# Patient Record
Sex: Male | Born: 1975 | Race: White | Hispanic: Yes | Marital: Married | Smoking: Never smoker
Health system: Southern US, Community
[De-identification: ages and names within clinical notes are randomized; demographics above are authoritative.]

---

## 2006-07-18 ENCOUNTER — Emergency Department (HOSPITAL_COMMUNITY): Admission: EM | Admit: 2006-07-18 | Discharge: 2006-07-18 | Payer: Self-pay | Admitting: *Deleted

## 2006-07-26 ENCOUNTER — Emergency Department (HOSPITAL_COMMUNITY): Admission: EM | Admit: 2006-07-26 | Discharge: 2006-07-26 | Payer: Self-pay | Admitting: Emergency Medicine

## 2014-01-10 ENCOUNTER — Emergency Department (HOSPITAL_COMMUNITY)
Admission: EM | Admit: 2014-01-10 | Discharge: 2014-01-10 | Disposition: A | Discharge status: Home / Self Care | Payer: Self-pay | Attending: Emergency Medicine | Admitting: Emergency Medicine

## 2014-01-10 ENCOUNTER — Encounter (HOSPITAL_COMMUNITY): Payer: Self-pay | Admitting: Emergency Medicine

## 2014-01-10 DIAGNOSIS — H5789 Other specified disorders of eye and adnexa: Secondary | ICD-10-CM | POA: Insufficient documentation

## 2014-01-10 DIAGNOSIS — H547 Unspecified visual loss: Secondary | ICD-10-CM

## 2014-01-10 DIAGNOSIS — H5460 Unqualified visual loss, one eye, unspecified: Secondary | ICD-10-CM | POA: Insufficient documentation

## 2014-01-10 NOTE — ED Notes (Signed)
Per family pts L eye dropped to outside corner and eyelid closed, pt now able to open eyelid but c/o eyeball "twitching" Pt had episode 2 weeks ago of temporary blindness in same eye. Pt was not seen for this

## 2014-01-10 NOTE — ED Provider Notes (Signed)
CSN: [id number]     Arrival date & time 01/10/14  2144 History   First MD Initiated Contact with Patient 01/10/14 2201     Chief Complaint  Patient presents with  . Eye Problem     (Consider location/radiation/quality/duration/timing/severity/associated sxs/prior Treatment) HPI Comments: 38 year old male, presents with a complaint of left eye abnormalities. He describes that 3 or 4 weeks ago he developed acute onset of visual loss in the left eye described as his vision going black, this was painless, unilateral and was associated with his left eyelid closing. He states that he could not open the eye by himself without the use of his hands, this lasted 5 or 6 minutes and then completely resolved. He denies any intermittent symptoms between then and today including no difficulty walking, talking or with balance. His vision has been clear but this evening he develop recurrent symptoms including family member stating that his left eye appeared to be facing downward into the left whereas his right eye was looking straight ahead. He reports that during this episode he again had painless visual loss. Family members report that his eye lid seemed to close, he was unable to open the eye by himself and after 5 or 6 minutes his symptoms resolved. On arrival the patient states that his vision is normal. He denies any fevers chills nausea vomiting coughing shortness of breath or any other complaints. He denies any ocular injury, no foreign body sensation, no tearing redness discharge or crusting.  Patient is a [age] y.o. male presenting with eye problem. The history is provided by the patient and a relative.  Eye Problem   History reviewed. No pertinent past medical history. History reviewed. No pertinent past surgical history. No family history on file. History  Substance Use Topics  . Smoking status: Never Smoker   . Smokeless tobacco: Not on file  . Alcohol Use: No    Review of Systems  All other  systems reviewed and are negative.     Allergies  Review of patient's allergies indicates no known allergies.  Home Medications   Prior to Admission medications   Not on File   BP 113/70  Pulse 82  Temp(Src) 97.4 F (36.3 C) (Oral)  Resp 18  Wt 177 lb (80.287 kg)  SpO2 98% Physical Exam  Nursing note and vitals reviewed. Constitutional: He appears well-developed and well-nourished. No distress.  HENT:  Head: Normocephalic and atraumatic.  Mouth/Throat: Oropharynx is clear and moist. No oropharyngeal exudate.  Eyes: Conjunctivae and EOM are normal. Pupils are equal, round, and reactive to light. Right eye exhibits no discharge. Left eye exhibits no discharge. No scleral icterus.  Normal extraocular movements, normal pupillary exam, normal eyelid exam, conjunctiva are clear, there is no drainage redness crusting or pooling of secretions. There is no swelling of the periorbital tissues. There is normal consensual response to light. Optic discs are seen bilaterally and are clear, no papilledema is seen.  Visual acuity  Bilateral 20/20 Right 20/20 Left 20/30  Neck: Normal range of motion. Neck supple. No JVD present. No thyromegaly present.  Cardiovascular: Normal rate, regular rhythm, normal heart sounds and intact distal pulses.  Exam reveals no gallop and no friction rub.   No murmur heard. Pulmonary/Chest: Effort normal and breath sounds normal. No respiratory distress. He has no wheezes. He has no rales.  Abdominal: Soft. Bowel sounds are normal. He exhibits no distension and no mass. There is no tenderness.  Musculoskeletal: Normal range of motion. He exhibits no  edema and no tenderness.  Lymphadenopathy:    He has no cervical adenopathy.  Neurological: He is alert. Coordination normal.  Skin: Skin is warm and dry. No rash noted. No erythema.  Psychiatric: He has a normal mood and affect. His behavior is normal.    ED Course  Procedures (including critical care  time) Labs Review Labs Reviewed - No data to display  Imaging Review No results found.    MDM   Final diagnoses:  Visual loss    The patient's exam shows no signs of ptosis or lid lag, no extraocular abnormalities and no other cranial nerve abnormalities. His visual acuity showed 20/30 in the left eye but otherwise normal, it is a rather unremarkable exam and in he has no positive findings here. This would apparently involve the second cranial nerve, third cranial nerve, possibly the fourth cranial nerve as well as the seventh cranial nerve. The patient has no other neurologic symptoms. Will refer to ophthalmology after discussion with neurology.  Discussed with Dr.Camilo who agrees that the patient would benefit from MRI scan with and without contrast in the morning however he does not think the patient needs to be admitted. The patient will be informed of the plan  10:50 PM - pt in agreement with plan - still has no sx at this time.  11:10 PM, discussed with patient the indication for MRI in the morning, he refuses stating that he cannot miss work. He states that he will follow up with ophthalmology as outpatient or return if symptoms worsen. Return precautions given, patient again cautioned that he needs a testing but he states that he cannot come in for it. He is still not having any symptoms at this time.  Vida Roller, MD 01/10/14 [phone]

## 2014-01-10 NOTE — Discharge Instructions (Signed)
My medical recommendation to you is that you need an MRI of your brain - this is what I and the Neurologist have both recommended - please follow up with the Eye specialist - call for next available appointment in the morning but you should return to the hospital for immediate reevaluation if you develop recurrent visual loss / pain / facial droop or inability to open your eyelid.

## 2014-01-11 NOTE — Progress Notes (Signed)
  CARE MANAGEMENT ED NOTE 01/11/2014  Patient:  Antonio Tate,[name]   Account Number:  [id number]  Date Initiated:  01/11/2014  Documentation initiated by:  Radford PaxFERRERO,Ciela Mahajan  Subjective/Objective Assessment:   Patient presents to Ed with left eye abnormality     Subjective/Objective Assessment Detail:     Action/Plan:   Action/Plan Detail:   Anticipated DC Date:  01/11/2014     Status Recommendation to Physician:   Result of Recommendation:    Other ED Services  Consult Working Plan    DC Planning Services  Other  PCP issues    Choice offered to / List presented to:            Status of service:  Completed, signed off  ED Comments:   ED Comments Detail:  EDCM spoke to patient at bedside.  Patient confirms he does not have a pcp or insurance.  [facility] with [facility].  [facility] informed [facility] that walk [facility] are welcome from 9am-1030am Mon-Thurs. [facility] informed patient that he may establish care, speak to a financial counselor, receive assistance with medications, and [facility].  EDCM also provided patient with phone number to inquire about Affordable Care Act, and DSS for Medicaid for insurance.  EDCM also provided patient with list of discounted pharmacies and websites needymeds.org and Good https://figueroa.info/X.com for medication assistance, list of financial resources inthe community such as [employer] and local churches, urban ministries and dental assistance for uninsured patients.  Patient thankful for resources.  No further EDCM needs at this time.

## 2017-07-16 ENCOUNTER — Ambulatory Visit: Payer: Managed Care, Other (non HMO) | Admitting: Family Medicine

## 2017-07-16 ENCOUNTER — Encounter: Payer: Self-pay | Admitting: Family Medicine

## 2017-07-16 VITALS — BP 126/80 | HR 84 | Temp 98.4°F | Ht 68.0 in | Wt 188.6 lb

## 2017-07-16 DIAGNOSIS — M79661 Pain in right lower leg: Secondary | ICD-10-CM | POA: Diagnosis not present

## 2017-07-16 DIAGNOSIS — F524 Premature ejaculation: Secondary | ICD-10-CM | POA: Diagnosis not present

## 2017-07-16 DIAGNOSIS — M79662 Pain in left lower leg: Secondary | ICD-10-CM

## 2017-07-16 MED ORDER — CITALOPRAM HYDROBROMIDE 20 MG PO TABS
20.0000 mg | ORAL_TABLET | Freq: Every day | ORAL | 3 refills | Status: DC
Start: 2017-07-16 — End: 2017-10-04

## 2017-07-16 NOTE — Patient Instructions (Signed)
Please start the celexa.  We will get a test to make sure you are getting good flow into your legs. You will be called to have this scheduled.  Please come back soon for your annual physical exam.  Take care, Dr Jimmey RalphParker

## 2017-07-16 NOTE — Assessment & Plan Note (Signed)
Start Celexa 20 mg daily.  Follow-up in about 3 months.

## 2017-07-16 NOTE — Progress Notes (Signed)
Subjective:  Antonio Tate a 42 y.o. male who presents today with a chief complaint of leg pain and to establish care.   HPI:  Leg Pain, New Issue Patient with symptoms for at least 20 years. It has been stable over that time. Symptoms are worse with exertion, but can be present constantly. Pain located strictly to his calves bilaterally. Sometimes has to stop driving due to the pain. Has never seen a physician for this before. No treatments tried. No obvious precipitating events. No obvious alleviating or aggravating factors.   Premature ejaculation, new issue Several year history.  Stable over last several years.  Previous treatments tried.  Normal libido.  No erectile dysfunction.  No obvious precipitating events.  No obvious alleviating or aggravating factors.  Has never seen a physician for this.  ROS: Per HPI, otherwise a 10 point review of systems was performed and was negative  PMH:  The following were reviewed and entered/updated in epic: History reviewed. No pertinent past medical history. Patient Active Problem List   Diagnosis Date Noted  . Bilateral calf pain 07/16/2017  . Premature ejaculation 07/16/2017   History reviewed. No pertinent surgical history.  Family History  Problem Relation Age of Onset  . Heart attack Father   . CVA Brother     Medications- reviewed and updated Current Outpatient Medications  Medication Sig Dispense Refill  . brompheniramine-pseudoephedrine-DM 30-2-10 MG/5ML syrup Take 10 mLs by mouth 4 (four) times daily as needed.    . citalopram (CELEXA) 20 MG tablet Take 1 tablet (20 mg total) by mouth daily. 30 tablet 3   No current facility-administered medications for this visit.    Allergies-reviewed and updated No Known Allergies  Social History   Socioeconomic History  . Marital status: Married    Spouse name: None  . Number of children: 1  . Years of education: None  . Highest education level: None  Social Needs  .  Financial resource strain: None  . Food insecurity - worry: None  . Food insecurity - inability: None  . Transportation needs - medical: None  . Transportation needs - non-medical: None  Occupational History  . None  Tobacco Use  . Smoking status: Never Smoker  . Smokeless tobacco: Never Used  Substance and Sexual Activity  . Alcohol use: No  . Drug use: No  . Sexual activity: None  Other Topics Concern  . None  Social History Narrative  . None     Objective:  Physical Exam: BP 126/80 (BP Location: Left Arm, Patient Position: Sitting, Cuff Size: Normal)   Pulse 84   Temp 98.4 F (36.9 C) (Oral)   Ht 5\' 8"  (1.727 m)   Wt 188 lb 9.6 oz (85.5 kg)   SpO2 97%   BMI 28.68 kg/m   Gen: NAD, resting comfortably CV: RRR with no murmurs appreciated Pulm: NWOB, CTAB with no crackles, wheezes, or rhonchi GI: Normal bowel sounds present. Soft, Nontender, Nondistended. MSK: -Back: No deformities.  Full range of motion.  No spinous process tenderness. -Hips: No deformities.  Full range of motion.  Strength 5 out of 5 in all directions. -Knees: No deformities.  Full range of motion.  Strength 5 out of 5 in all directions.  No popliteal masses noted. -Calves: Hypertrophy of gastrocnemius noted bilaterally.  Mildly tender to palpation. -Feet: No deformities.  Strength 5 out of 5 with plantarflexion and dorsiflexion.  DP and PT pulses 2+ and symmetric bilaterally. Skin: Warm, dry Neuro: Grossly  normal, moves all extremities Psych: Normal affect and thought content  Assessment/Plan:  Bilateral calf pain Unclear etiology.  Concern for intermittent compression syndrome versus popliteal artery entrapment.  We will check ABIs.  Hope to have these done pre-and post exercise.  If negative, would consider angiogram to rule out popliteal artery entrapment or referral for compartment syndrome testing.  Premature ejaculation Start Celexa 20 mg daily.  Follow-up in about 3 months.  Preventative  healthcare Patient will return soon for CPE.  Katina Degree. Jimmey Ralph, MD 07/16/2017 5:11 PM

## 2017-07-16 NOTE — Assessment & Plan Note (Signed)
Unclear etiology.  Concern for intermittent compression syndrome versus popliteal artery entrapment.  We will check ABIs.  Hope to have these done pre-and post exercise.  If negative, would consider angiogram to rule out popliteal artery entrapment or referral for compartment syndrome testing.

## 2017-08-09 ENCOUNTER — Other Ambulatory Visit: Payer: Self-pay | Admitting: Family Medicine

## 2017-08-09 DIAGNOSIS — M79662 Pain in left lower leg: Principal | ICD-10-CM

## 2017-08-09 DIAGNOSIS — M79661 Pain in right lower leg: Secondary | ICD-10-CM

## 2017-08-09 NOTE — Progress Notes (Signed)
[id number]

## 2017-08-12 ENCOUNTER — Ambulatory Visit (HOSPITAL_COMMUNITY)
Admission: RE | Admit: 2017-08-12 | Discharge: 2017-08-12 | Disposition: A | Discharge status: Home / Self Care | Payer: Managed Care, Other (non HMO) | Source: Ambulatory Visit | Attending: Family Medicine | Admitting: Family Medicine

## 2017-08-12 DIAGNOSIS — M79662 Pain in left lower leg: Secondary | ICD-10-CM | POA: Diagnosis not present

## 2017-08-12 DIAGNOSIS — M79661 Pain in right lower leg: Secondary | ICD-10-CM | POA: Insufficient documentation

## 2017-08-17 ENCOUNTER — Other Ambulatory Visit: Payer: Self-pay

## 2017-08-17 DIAGNOSIS — M79669 Pain in unspecified lower leg: Secondary | ICD-10-CM

## 2017-08-23 ENCOUNTER — Ambulatory Visit (INDEPENDENT_AMBULATORY_CARE_PROVIDER_SITE_OTHER): Payer: Self-pay | Admitting: Orthopaedic Surgery

## 2017-08-31 ENCOUNTER — Ambulatory Visit (INDEPENDENT_AMBULATORY_CARE_PROVIDER_SITE_OTHER): Payer: Self-pay | Admitting: Orthopaedic Surgery

## 2017-10-04 ENCOUNTER — Other Ambulatory Visit: Payer: Self-pay

## 2017-10-04 MED ORDER — CITALOPRAM HYDROBROMIDE 20 MG PO TABS: 20.0000 mg | ORAL_TABLET | Freq: Every day | ORAL | 3 refills | Status: DC

## 2017-10-05 ENCOUNTER — Other Ambulatory Visit: Payer: Self-pay | Admitting: *Deleted

## 2017-10-05 MED ORDER — CITALOPRAM HYDROBROMIDE 20 MG PO TABS: 20.0000 mg | ORAL_TABLET | Freq: Every day | ORAL | 3 refills | Status: DC

## 2017-12-08 ENCOUNTER — Encounter: Payer: Self-pay | Admitting: Family Medicine

## 2017-12-09 ENCOUNTER — Encounter: Payer: Self-pay | Admitting: Family Medicine

## 2017-12-09 ENCOUNTER — Ambulatory Visit (INDEPENDENT_AMBULATORY_CARE_PROVIDER_SITE_OTHER): Payer: Managed Care, Other (non HMO) | Admitting: Family Medicine

## 2017-12-09 VITALS — BP 122/74 | HR 84 | Temp 98.0°F | Ht 68.0 in | Wt 188.2 lb

## 2017-12-09 DIAGNOSIS — Z23 Encounter for immunization: Secondary | ICD-10-CM

## 2017-12-09 DIAGNOSIS — L659 Nonscarring hair loss, unspecified: Secondary | ICD-10-CM

## 2017-12-09 DIAGNOSIS — F524 Premature ejaculation: Secondary | ICD-10-CM | POA: Diagnosis not present

## 2017-12-09 DIAGNOSIS — Z13220 Encounter for screening for lipoid disorders: Secondary | ICD-10-CM

## 2017-12-09 DIAGNOSIS — Z0001 Encounter for general adult medical examination with abnormal findings: Secondary | ICD-10-CM

## 2017-12-09 DIAGNOSIS — R200 Anesthesia of skin: Secondary | ICD-10-CM

## 2017-12-09 DIAGNOSIS — Z114 Encounter for screening for human immunodeficiency virus [HIV]: Secondary | ICD-10-CM

## 2017-12-09 MED ORDER — CITALOPRAM HYDROBROMIDE 20 MG PO TABS: 20.0000 mg | ORAL_TABLET | Freq: Every day | ORAL | 3 refills | Status: DC

## 2017-12-09 NOTE — Addendum Note (Signed)
Addended by: Koleen DistanceAGNER, AMBER M on: 12/09/2017 04:59 PM   Modules accepted: Orders

## 2017-12-09 NOTE — Progress Notes (Signed)
Subjective:  Antonio Tate is a 42 y.o. male who presents today for his annual comprehensive physical exam.    HPI:  He has 2 minor complaints today:  1. Hairless patch on left chin.  No signs of few weeks ago.  Stable over that time.  No clear precipitating events.  No redness.  No irritation.  No pain.  2.  Right fifth finger numbness.  Only happens when waking up from sleep.  Takes may be due to sleep position.  No weakness or numbness.  Lifestyle Diet: No specific diets Exercise: No specific exercises.   Depression screen PHQ 2/9 07/16/2017  Decreased Interest 0  Down, Depressed, Hopeless 0  PHQ - 2 Score 0    Health Maintenance Due  Topic Date Due  . HIV Screening  10/01/1991  . TETANUS/TDAP  10/01/1995     ROS: Per HPI, otherwise a complete review of systems was negative.   PMH:  The following were reviewed and entered/updated in epic: History reviewed. No pertinent past medical history. Patient Active Problem List   Diagnosis Date Noted  . Bilateral calf pain 07/16/2017  . Premature ejaculation 07/16/2017   History reviewed. No pertinent surgical history.  Family History  Problem Relation Age of Onset  . Heart attack Father   . Alcohol abuse Father   . CVA Brother     Medications- reviewed and updated Current Outpatient Medications  Medication Sig Dispense Refill  . citalopram (CELEXA) 20 MG tablet Take 1 tablet (20 mg total) by mouth daily. 90 tablet 3   No current facility-administered medications for this visit.     Allergies-reviewed and updated No Known Allergies  Social History   Socioeconomic History  . Marital status: Married    Spouse name: Not on file  . Number of children: 1  . Years of education: Not on file  . Highest education level: Not on file  Occupational History  . Not on file  Social Needs  . Financial resource strain: Not on file  . Food insecurity:    Worry: Not on file    Inability: Not on file  .  Transportation needs:    Medical: Not on file    Non-medical: Not on file  Tobacco Use  . Smoking status: Never Smoker  . Smokeless tobacco: Never Used  Substance and Sexual Activity  . Alcohol use: No  . Drug use: No  . Sexual activity: Not on file  Lifestyle  . Physical activity:    Days per week: Not on file    Minutes per session: Not on file  . Stress: Not on file  Relationships  . Social connections:    Talks on phone: Not on file    Gets together: Not on file    Attends religious service: Not on file    Active member of club or organization: Not on file    Attends meetings of clubs or organizations: Not on file    Relationship status: Not on file  Other Topics Concern  . Not on file  Social History Narrative  . Not on file    Objective:  Physical Exam: BP 122/74 (BP Location: Left Arm, Patient Position: Sitting, Cuff Size: Normal)   Pulse 84   Temp 98 F (36.7 C) (Oral)   Ht 5\' 8"  (1.727 m)   Wt 188 lb 3.2 oz (85.4 kg)   SpO2 96%   BMI 28.62 kg/m   Body mass index is 28.62 kg/m. Wt Readings from  Last 3 Encounters:  12/09/17 188 lb 3.2 oz (85.4 kg)  07/16/17 188 lb 9.6 oz (85.5 kg)  01/10/14 177 lb (80.3 kg)   Gen: NAD, resting comfortably HEENT: TMs normal bilaterally. OP clear. No thyromegaly noted.  CV: RRR with no murmurs appreciated Pulm: NWOB, CTAB with no crackles, wheezes, or rhonchi GI: Normal bowel sounds present. Soft, Nontender, Nondistended. MSK: no edema, cyanosis, or clubbing noted.  Spurling negative bilaterally.  Tinel sign negative at right medial malleolus.  Phalen's test negative at wrists.  Skin: warm, dry.  Approximately 2 cm hairless patch noted on inferior aspect of left chin. Neuro: CN2-12 grossly intact. Strength 5/5 in upper and lower extremities. Reflexes symmetric and intact bilaterally.  Psych: Normal affect and thought content  Assessment/Plan:  Premature ejaculation Stable.  Celexa refilled.  Alopecia Unclear  etiology.  Possibly related to chemical exposure at work.  Could be early onset alopecia areata.  Given the symptoms seem to be stable, we will continue with watchful waiting for now.  Discussed reasons to return to care.  Finger numbness Likely mild ulnar nerve impingement during sleep.  Recommended patient change sleep position and avoid compressing the ulnar nerve.  Symptoms are not particularly bothersome to patient at this time.  Continue with watchful waiting.  Discussed reasons to return to care. Check CBC, TSH, and CMET.  Preventative Healthcare: Check lipid panel.  Screen for diabetes on CMET.  Check HIV antibody.  Give Tdap today.  Patient Counseling(The following topics were reviewed and/or handout was given):  -Nutrition: Stressed importance of moderation in sodium/caffeine intake, saturated fat and cholesterol, caloric balance, sufficient intake of fresh fruits, vegetables, and fiber.  -Stressed the importance of regular exercise.   -Substance Abuse: Discussed cessation/primary prevention of tobacco, alcohol, or other drug use; driving or other dangerous activities under the influence; availability of treatment for abuse.   -Injury prevention: Discussed safety belts, safety helmets, smoke detector, smoking near bedding or upholstery.   -Sexuality: Discussed sexually transmitted diseases, partner selection, use of condoms, avoidance of unintended pregnancy and contraceptive alternatives.   -Dental health: Discussed importance of regular tooth brushing, flossing, and dental visits.  -Health maintenance and immunizations reviewed. Please refer to Health maintenance section.  Return to care in 1 year for next preventative visit.   Katina Degreealeb M. Jimmey RalphParker, MD 12/09/2017 4:14 PM

## 2017-12-09 NOTE — Patient Instructions (Addendum)
It was very nice to see you today!  Your hairless patch could be from chemical exposure at work.  Is also possible that this could be early onset hearing loss.  Please keep a close eye on this area and let me know if it worsens over the next several weeks to months.  I think your finger numbness is due to your sleep position.  Please try to keep your arm straight not put your body weight on your arm at night.  If your symptoms worsen or do not improve, please let me know.  We will check blood work today.  Come back to see me in 1 year, or sooner as needed.  Take care, Dr Jerline Pain   Preventive Care 40-64 Years, Male Preventive care refers to lifestyle choices and visits with your health care provider that can promote health and wellness. What does preventive care include?  A yearly physical exam. This is also called an annual well check.  Dental exams once or twice a year.  Routine eye exams. Ask your health care provider how often you should have your eyes checked.  Personal lifestyle choices, including: ? Daily care of your teeth and gums. ? Regular physical activity. ? Eating a healthy diet. ? Avoiding tobacco and drug use. ? Limiting alcohol use. ? Practicing safe sex. ? Taking low-dose aspirin every day starting at age 7. What happens during an annual well check? The services and screenings done by your health care provider during your annual well check will depend on your age, overall health, lifestyle risk factors, and family history of disease. Counseling Your health care provider may ask you questions about your:  Alcohol use.  Tobacco use.  Drug use.  Emotional well-being.  Home and relationship well-being.  Sexual activity.  Eating habits.  Work and work Statistician.  Screening You may have the following tests or measurements:  Height, weight, and BMI.  Blood pressure.  Lipid and cholesterol levels. These may be checked every 5 years, or more  frequently if you are over 64 years old.  Skin check.  Lung cancer screening. You may have this screening every year starting at age 27 if you have a 30-pack-year history of smoking and currently smoke or have quit within the past 15 years.  Fecal occult blood test (FOBT) of the stool. You may have this test every year starting at age 58.  Flexible sigmoidoscopy or colonoscopy. You may have a sigmoidoscopy every 5 years or a colonoscopy every 10 years starting at age 80.  Prostate cancer screening. Recommendations will vary depending on your family history and other risks.  Hepatitis C blood test.  Hepatitis B blood test.  Sexually transmitted disease (STD) testing.  Diabetes screening. This is done by checking your blood sugar (glucose) after you have not eaten for a while (fasting). You may have this done every 1-3 years.  Discuss your test results, treatment options, and if necessary, the need for more tests with your health care provider. Vaccines Your health care provider may recommend certain vaccines, such as:  Influenza vaccine. This is recommended every year.  Tetanus, diphtheria, and acellular pertussis (Tdap, Td) vaccine. You may need a Td booster every 10 years.  Varicella vaccine. You may need this if you have not been vaccinated.  Zoster vaccine. You may need this after age 67.  Measles, mumps, and rubella (MMR) vaccine. You may need at least one dose of MMR if you were born in 1957 or later. You may  also need a second dose.  Pneumococcal 13-valent conjugate (PCV13) vaccine. You may need this if you have certain conditions and have not been vaccinated.  Pneumococcal polysaccharide (PPSV23) vaccine. You may need one or two doses if you smoke cigarettes or if you have certain conditions.  Meningococcal vaccine. You may need this if you have certain conditions.  Hepatitis A vaccine. You may need this if you have certain conditions or if you travel or work in places  where you may be exposed to hepatitis A.  Hepatitis B vaccine. You may need this if you have certain conditions or if you travel or work in places where you may be exposed to hepatitis B.  Haemophilus influenzae type b (Hib) vaccine. You may need this if you have certain risk factors.  Talk to your health care provider about which screenings and vaccines you need and how often you need them. This information is not intended to replace advice given to you by your health care provider. Make sure you discuss any questions you have with your health care provider. Document Released: 06/28/2015 Document Revised: 02/19/2016 Document Reviewed: 04/02/2015 Elsevier Interactive Patient Education  [employer].

## 2017-12-09 NOTE — Assessment & Plan Note (Signed)
Stable. Celexa refilled. 

## 2017-12-10 ENCOUNTER — Other Ambulatory Visit: Payer: Self-pay

## 2017-12-10 ENCOUNTER — Encounter: Payer: Self-pay | Admitting: Family Medicine

## 2017-12-10 DIAGNOSIS — E785 Hyperlipidemia, unspecified: Secondary | ICD-10-CM | POA: Insufficient documentation

## 2017-12-10 LAB — TSH: TSH: 0.59 u[IU]/mL (ref 0.35–4.50)

## 2017-12-10 LAB — COMPREHENSIVE METABOLIC PANEL
ALT: 70 U/L — AB (ref 0–53)
AST: 33 U/L (ref 0–37)
Albumin: 4.5 g/dL (ref 3.5–5.2)
Alkaline Phosphatase: 111 U/L (ref 39–117)
BILIRUBIN TOTAL: 0.4 mg/dL (ref 0.2–1.2)
BUN: 9 mg/dL (ref 6–23)
CO2: 23 meq/L (ref 19–32)
CREATININE: 1.02 mg/dL (ref 0.40–1.50)
Calcium: 9.4 mg/dL (ref 8.4–10.5)
Chloride: 107 mEq/L (ref 96–112)
GFR: 85.46 mL/min (ref >60.00)
GLUCOSE: 96 mg/dL (ref 70–99)
Potassium: 4.2 mEq/L (ref 3.5–5.1)
SODIUM: 141 meq/L (ref 135–145)
Total Protein: 7.2 g/dL (ref 6.0–8.3)

## 2017-12-10 LAB — CBC
HCT: 46.2 % (ref 39.0–52.0)
Hemoglobin: 15.9 g/dL (ref 13.0–17.0)
MCHC: 34.5 g/dL (ref 30.0–36.0)
MCV: 85.2 fl (ref 78.0–100.0)
Platelets: 174 10*3/uL (ref 150.0–400.0)
RBC: 5.42 Mil/uL (ref 4.22–5.81)
RDW: 13.9 % (ref 11.5–15.5)
WBC: 6.2 10*3/uL (ref 4.0–10.5)

## 2017-12-10 LAB — LIPID PANEL
Cholesterol: 211 mg/dL — ABNORMAL HIGH (ref 0–200)
HDL: 35.7 mg/dL — AB (ref >39.00)
NONHDL: 175.37
Total CHOL/HDL Ratio: 6
Triglycerides: 376 mg/dL — ABNORMAL HIGH (ref 0.0–149.0)
VLDL: 75.2 mg/dL — ABNORMAL HIGH (ref 0.0–40.0)

## 2017-12-10 LAB — HIV ANTIBODY (ROUTINE TESTING W REFLEX): HIV: NONREACTIVE

## 2017-12-10 LAB — LDL CHOLESTEROL, DIRECT: Direct LDL: 109 mg/dL

## 2017-12-13 ENCOUNTER — Other Ambulatory Visit: Payer: Self-pay

## 2017-12-13 DIAGNOSIS — R7401 Elevation of levels of liver transaminase levels: Secondary | ICD-10-CM

## 2017-12-13 DIAGNOSIS — R740 Nonspecific elevation of levels of transaminase and lactic acid dehydrogenase [LDH]: Principal | ICD-10-CM

## 2018-01-11 ENCOUNTER — Other Ambulatory Visit (INDEPENDENT_AMBULATORY_CARE_PROVIDER_SITE_OTHER): Payer: Managed Care, Other (non HMO)

## 2018-01-11 DIAGNOSIS — R740 Nonspecific elevation of levels of transaminase and lactic acid dehydrogenase [LDH]: Secondary | ICD-10-CM | POA: Diagnosis not present

## 2018-01-11 DIAGNOSIS — R7401 Elevation of levels of liver transaminase levels: Secondary | ICD-10-CM

## 2018-01-11 LAB — COMPREHENSIVE METABOLIC PANEL
ALBUMIN: 4.1 g/dL (ref 3.5–5.2)
ALK PHOS: 76 U/L (ref 39–117)
ALT: 61 U/L — ABNORMAL HIGH (ref 0–53)
AST: 29 U/L (ref 0–37)
BUN: 17 mg/dL (ref 6–23)
CHLORIDE: 104 meq/L (ref 96–112)
CO2: 26 mEq/L (ref 19–32)
Calcium: 9.1 mg/dL (ref 8.4–10.5)
Creatinine, Ser: 1 mg/dL (ref 0.40–1.50)
GFR: 87.4 mL/min (ref >60.00)
GLUCOSE: 92 mg/dL (ref 70–99)
POTASSIUM: 3.8 meq/L (ref 3.5–5.1)
SODIUM: 137 meq/L (ref 135–145)
Total Bilirubin: 0.5 mg/dL (ref 0.2–1.2)
Total Protein: 7.3 g/dL (ref 6.0–8.3)

## 2018-01-11 NOTE — Progress Notes (Signed)
Please inform patient of the following:  His liver function is still elevated, but improving. We should recheck again in 6-12 months to make sure that it is stable.  Katina Degreealeb M. Jimmey RalphParker, MD 01/11/2018 1:06 PM

## 2018-01-17 ENCOUNTER — Other Ambulatory Visit: Payer: Self-pay

## 2018-01-17 DIAGNOSIS — R7989 Other specified abnormal findings of blood chemistry: Secondary | ICD-10-CM

## 2018-01-17 DIAGNOSIS — R945 Abnormal results of liver function studies: Principal | ICD-10-CM

## 2018-03-13 ENCOUNTER — Encounter (HOSPITAL_COMMUNITY): Payer: Self-pay | Admitting: *Deleted

## 2018-03-13 ENCOUNTER — Emergency Department (HOSPITAL_COMMUNITY)
Admission: EM | Admit: 2018-03-13 | Discharge: 2018-03-13 | Disposition: A | Discharge status: Home / Self Care | Payer: Managed Care, Other (non HMO) | Attending: Emergency Medicine | Admitting: Emergency Medicine

## 2018-03-13 ENCOUNTER — Other Ambulatory Visit: Payer: Self-pay

## 2018-03-13 DIAGNOSIS — Z79899 Other long term (current) drug therapy: Secondary | ICD-10-CM | POA: Insufficient documentation

## 2018-03-13 DIAGNOSIS — R1032 Left lower quadrant pain: Secondary | ICD-10-CM | POA: Insufficient documentation

## 2018-03-13 DIAGNOSIS — R310 Gross hematuria: Secondary | ICD-10-CM | POA: Insufficient documentation

## 2018-03-13 DIAGNOSIS — R109 Unspecified abdominal pain: Secondary | ICD-10-CM

## 2018-03-13 LAB — URINALYSIS, ROUTINE W REFLEX MICROSCOPIC
Bilirubin Urine: NEGATIVE
Glucose, UA: NEGATIVE mg/dL
Ketones, ur: NEGATIVE mg/dL
Leukocytes, UA: NEGATIVE
Nitrite: NEGATIVE
Protein, ur: 100 mg/dL — AB
RBC / HPF: >50 RBC/hpf — ABNORMAL HIGH (ref 0–5)
SPECIFIC GRAVITY, URINE: 1.019 (ref 1.005–1.030)
pH: 7 (ref 5.0–8.0)

## 2018-03-13 LAB — BASIC METABOLIC PANEL
Anion gap: 14 (ref 5–15)
BUN: 16 mg/dL (ref 6–20)
CHLORIDE: 108 mmol/L (ref 98–111)
CO2: 23 mmol/L (ref 22–32)
CREATININE: 1.37 mg/dL — AB (ref 0.61–1.24)
Calcium: 10.1 mg/dL (ref 8.9–10.3)
GFR calc Af Amer: >60 mL/min (ref >60)
GFR calc non Af Amer: >60 mL/min (ref >60)
Glucose, Bld: 133 mg/dL — ABNORMAL HIGH (ref 70–99)
POTASSIUM: 3.8 mmol/L (ref 3.5–5.1)
SODIUM: 145 mmol/L (ref 135–145)

## 2018-03-13 LAB — CBC
HEMATOCRIT: 48.4 % (ref 39.0–52.0)
Hemoglobin: 17.1 g/dL — ABNORMAL HIGH (ref 13.0–17.0)
MCH: 29.4 pg (ref 26.0–34.0)
MCHC: 35.3 g/dL (ref 30.0–36.0)
MCV: 83.3 fL (ref 78.0–100.0)
PLATELETS: 193 10*3/uL (ref 150–400)
RBC: 5.81 MIL/uL (ref 4.22–5.81)
RDW: 13.1 % (ref 11.5–15.5)
WBC: 11 10*3/uL — AB (ref 4.0–10.5)

## 2018-03-13 MED ORDER — ONDANSETRON HCL 4 MG/2ML IJ SOLN
4.0000 mg | Freq: Once | INTRAMUSCULAR | Status: AC
  Administered 2018-03-13: 4 mg via INTRAVENOUS
  Filled 2018-03-13: qty 2

## 2018-03-13 MED ORDER — NAPROXEN 500 MG PO TABS: 500.0000 mg | ORAL_TABLET | Freq: Two times a day (BID) | ORAL | 0 refills | Status: DC

## 2018-03-13 MED ORDER — OXYCODONE-ACETAMINOPHEN 5-325 MG PO TABS: 1.0000 | ORAL_TABLET | ORAL | 0 refills | Status: DC | PRN

## 2018-03-13 MED ORDER — FENTANYL CITRATE (PF) 100 MCG/2ML IJ SOLN
50.0000 ug | INTRAMUSCULAR | Status: DC | PRN
  Administered 2018-03-13: 50 ug via INTRAVENOUS
  Filled 2018-03-13: qty 2

## 2018-03-13 NOTE — ED Provider Notes (Signed)
[facility]-EMERGENCY DEPT Provider Note   CSN: [id number] Arrival date & time: 03/13/18  1452     History   Chief Complaint Chief Complaint  Patient presents with  . Flank Pain    HPI Antonio Tate is a 42 y.o. male.  Patient presents to the emergency department with acute onset of left flank pain earlier today.  Pain was associate with 2 episodes of vomiting.  Pain radiates to the left lower abdomen.  No associated fevers, chest pain, shortness of breath.  No diarrhea or constipation.  Patient denies dysuria.  He states that he had a similar episode several years ago but did not get checked and symptoms resolved spontaneously.  He has not noted any blood in urine.  Onset of symptoms acute.  Not improved at home with Tylenol.  Patient given fentanyl upon arrival to the emergency department.  Pain is now resolved. The onset of this condition was acute. The course is constant. Aggravating factors: none. Alleviating factors: none.       History reviewed. No pertinent past medical history.  Patient Active Problem List   Diagnosis Date Noted  . Dyslipidemia 12/10/2017  . Bilateral calf pain 07/16/2017  . Premature ejaculation 07/16/2017    History reviewed. No pertinent surgical history.      Home Medications    Prior to Admission medications   Medication Sig Start Date End Date Taking? Authorizing Provider  citalopram (CELEXA) 20 MG tablet Take 1 tablet (20 mg total) by mouth daily. 12/09/17   Ardith Dark, MD    Family History Family History  Problem Relation Age of Onset  . Heart attack Father   . Alcohol abuse Father   . CVA Brother     Social History Social History   Tobacco Use  . Smoking status: Never Smoker  . Smokeless tobacco: Never Used  Substance Use Topics  . Alcohol use: No  . Drug use: No     Allergies   Patient has no known allergies.   Review of Systems Review of Systems  Constitutional: Negative for  fever.  HENT: Negative for rhinorrhea and sore throat.   Eyes: Negative for redness.  Respiratory: Negative for cough.   Cardiovascular: Negative for chest pain.  Gastrointestinal: Positive for nausea and vomiting. Negative for abdominal pain and diarrhea.  Genitourinary: Positive for flank pain. Negative for dysuria.  Musculoskeletal: Negative for myalgias.  Skin: Negative for rash.  Neurological: Negative for headaches.     Physical Exam Updated Vital Signs BP (!) 151/99 (BP Location: Left Arm)   Pulse 71   Temp 97.9 F (36.6 C) (Oral)   Resp 20   Wt 84.9 kg   SpO2 100%   BMI 28.46 kg/m   Physical Exam  Constitutional: He appears well-developed and well-nourished.  HENT:  Head: Normocephalic and atraumatic.  Eyes: Conjunctivae are normal. Right eye exhibits no discharge. Left eye exhibits no discharge.  Neck: Normal range of motion. Neck supple.  Cardiovascular: Normal rate, regular rhythm and normal heart sounds.  Pulmonary/Chest: Effort normal and breath sounds normal.  Abdominal: Soft. There is no tenderness.  Neurological: He is alert.  Skin: Skin is warm and dry.  Psychiatric: He has a normal mood and affect.  Nursing note and vitals reviewed.    ED Treatments / Results  Labs (all labs ordered are listed, but only abnormal results are displayed) Labs Reviewed  URINALYSIS, ROUTINE W REFLEX MICROSCOPIC - Abnormal; Notable for the following components:  Result Value   Hgb urine dipstick LARGE (*)    Protein, ur 100 (*)    RBC / HPF >50 (*)    Bacteria, UA RARE (*)    All other components within normal limits  BASIC METABOLIC PANEL - Abnormal; Notable for the following components:   Glucose, Bld 133 (*)    Creatinine, Ser 1.37 (*)    All other components within normal limits  CBC - Abnormal; Notable for the following components:   WBC 11.0 (*)    Hemoglobin 17.1 (*)    All other components within normal limits    EKG None  Radiology No results  found.  Procedures Procedures (including critical care time)  Medications Ordered in ED Medications  fentaNYL (SUBLIMAZE) injection 50 mcg (50 mcg Intravenous Given 03/13/18 1554)  ondansetron (ZOFRAN) injection 4 mg (4 mg Intravenous Given 03/13/18 1554)     Initial Impression / Assessment and Plan / ED Course  I have reviewed the triage vital signs and the nursing notes.  Pertinent labs & imaging results that were available during my care of the patient were reviewed by me and considered in my medical decision making (see chart for details).     Patient seen and examined.  Symptoms are now resolved.  Renal function is normal.  Urine appears dark.  If there is blood in the urine and symptoms continue to resolve, likely patient with ureteral colic.  If pain returns, will perform CT renal.  Otherwise will monitor in the ED.  Vital signs reviewed and are as follows: BP (!) 151/99 (BP Location: Left Arm)   Pulse 71   Temp 97.9 F (36.6 C) (Oral)   Resp 20   Wt 84.9 kg   SpO2 100%   BMI 28.46 kg/m   7:13 PM patient remains pain-free in the emergency department.  Patient will be given medication for home in case pain returns.  Otherwise, he will strain his urine and follow-up with urology.   Patient counseled on use of narcotic pain medications. Counseled not to combine these medications with others containing tylenol. Urged not to drink alcohol, drive, or perform any other activities that requires focus while taking these medications. The patient verbalizes understanding and agrees with the plan.     Final Clinical Impressions(s) / ED Diagnoses   Final diagnoses:  Left flank pain  Gross hematuria   Constellation of left flank pain radiating to the left groin, acute onset, severe with vomiting, abrupt resolution with gross hematuria suggestive of ureteral colic.  Patient has remained asymptomatic since time of my exam.  Will avoid imaging at this time.  Otherwise will treat as  ureteral colic.  Patient counseled to return with worsening symptoms, uncontrolled pain.   ED Discharge Orders         Ordered    oxyCODONE-acetaminophen (PERCOCET/ROXICET) 5-325 MG tablet  Every 4 hours PRN     03/13/18 1901    naproxen (NAPROSYN) 500 MG tablet  2 times daily     03/13/18 1901           Renne Crigler, Cordelia Poche 03/13/18 1915    Lorre Nick, MD 03/14/18 [phone]

## 2018-03-13 NOTE — ED Triage Notes (Signed)
Pt reports cough, congestion and headache. She says that she has been out of her albuterol for about 2 months.

## 2018-03-13 NOTE — Discharge Instructions (Signed)
Please read and follow all provided instructions.  Your diagnoses today include:  1. Left flank pain   2. Gross hematuria     Tests performed today include:  Urine test that showed blood in your urine and no infection  Blood test that showed near-normal kidney function  Vital signs. See below for your results today.   Medications prescribed:   Percocet (oxycodone/acetaminophen) - narcotic pain medication  DO NOT drive or perform any activities that require you to be awake and alert because this medicine can make you drowsy. BE VERY CAREFUL not to take multiple medicines containing Tylenol (also called acetaminophen). Doing so can lead to an overdose which can damage your liver and cause liver failure and possibly death.   Naproxen - anti-inflammatory pain medication  Do not exceed 500mg  naproxen every 12 hours, take with food  You have been prescribed an anti-inflammatory medication or NSAID. Take with food. Take smallest effective dose for the shortest duration needed for your pain. Stop taking if you experience stomach pain or vomiting.   Take any prescribed medications only as directed.  Home care instructions:  Follow any educational materials contained in this packet.  Please double your fluid intake for the next several days. Strain your urine and save any stones that may pass.   BE VERY CAREFUL not to take multiple medicines containing Tylenol (also called acetaminophen). Doing so can lead to an overdose which can damage your liver and cause liver failure and possibly death.   Follow-up instructions: Please follow-up with your urologist or the urologist referral (provided on front page) in the next 1 week for further evaluation of your symptoms.  Return instructions:  If you need to return to the Emergency Department, go to [facility] and not [facility]. The urologists are located at [facility] and can better care for you at this  location.   Please return to the Emergency Department if you experience worsening symptoms.  Please return if you develop fever or uncontrolled pain or vomiting.  Please return if you have any other emergent concerns.  Additional Information:  Your vital signs today were: BP 115/75    Pulse 70    Temp 98.4 F (36.9 C) (Oral)    Resp 18    Wt 84.9 kg    SpO2 99%    BMI 28.46 kg/m  If your blood pressure (BP) was elevated above 135/85 this visit, please have this repeated by your doctor within one month. --------------

## 2018-03-13 NOTE — ED Triage Notes (Signed)
Pt reports today onset of lower abdominal pain that radiates around to the left flank area, associated with nausea and difficulty urinating. Pt took tylenol for pain. He is restless, vomiting, and diaphortetic in triage, guarding the left flank area.

## 2018-04-26 ENCOUNTER — Ambulatory Visit (INDEPENDENT_AMBULATORY_CARE_PROVIDER_SITE_OTHER): Payer: Managed Care, Other (non HMO)

## 2018-04-26 ENCOUNTER — Encounter: Payer: Self-pay | Admitting: Family Medicine

## 2018-04-26 DIAGNOSIS — Z23 Encounter for immunization: Secondary | ICD-10-CM | POA: Diagnosis not present

## 2018-04-26 NOTE — Patient Instructions (Signed)
There are no preventive care reminders to display for this patient.  Depression screen PHQ 2/9 07/16/2017  Decreased Interest 0  Down, Depressed, Hopeless 0  PHQ - 2 Score 0

## 2018-04-26 NOTE — Progress Notes (Signed)
Patient here today for Flu Vaccine. Administered in left arm. VIS given. Patient tolerated well 

## 2018-04-26 NOTE — Progress Notes (Signed)
I have reviewed the patient's encounter and agree with the documentation.  Katina Degreealeb M. Jimmey RalphParker, MD 04/26/2018 10:55 AM

## 2019-05-25 ENCOUNTER — Other Ambulatory Visit: Payer: Self-pay

## 2019-05-25 DIAGNOSIS — Z20822 Contact with and (suspected) exposure to covid-19: Secondary | ICD-10-CM

## 2019-05-26 LAB — NOVEL CORONAVIRUS, NAA: SARS-CoV-2, NAA: NOT DETECTED

## 2020-07-20 ENCOUNTER — Encounter (HOSPITAL_COMMUNITY): Payer: Self-pay

## 2020-07-20 ENCOUNTER — Emergency Department (HOSPITAL_COMMUNITY): Payer: Self-pay

## 2020-07-20 ENCOUNTER — Emergency Department (HOSPITAL_COMMUNITY)
Admission: EM | Admit: 2020-07-20 | Discharge: 2020-07-20 | Disposition: A | Discharge status: Home / Self Care | Payer: Self-pay | Attending: Emergency Medicine | Admitting: Emergency Medicine

## 2020-07-20 ENCOUNTER — Other Ambulatory Visit: Payer: Self-pay

## 2020-07-20 DIAGNOSIS — N23 Unspecified renal colic: Secondary | ICD-10-CM

## 2020-07-20 DIAGNOSIS — N132 Hydronephrosis with renal and ureteral calculous obstruction: Secondary | ICD-10-CM | POA: Insufficient documentation

## 2020-07-20 DIAGNOSIS — N200 Calculus of kidney: Secondary | ICD-10-CM

## 2020-07-20 DIAGNOSIS — K760 Fatty (change of) liver, not elsewhere classified: Secondary | ICD-10-CM | POA: Insufficient documentation

## 2020-07-20 LAB — URINALYSIS, ROUTINE W REFLEX MICROSCOPIC
Bacteria, UA: NONE SEEN
Bilirubin Urine: NEGATIVE
Glucose, UA: NEGATIVE mg/dL
Ketones, ur: 5 mg/dL — AB
Leukocytes,Ua: NEGATIVE
Nitrite: NEGATIVE
Protein, ur: 30 mg/dL — AB
RBC / HPF: >50 RBC/hpf — ABNORMAL HIGH (ref 0–5)
Specific Gravity, Urine: 1.03 (ref 1.005–1.030)
pH: 5 (ref 5.0–8.0)

## 2020-07-20 MED ORDER — OXYCODONE-ACETAMINOPHEN 5-325 MG PO TABS: 2.0000 | ORAL_TABLET | Freq: Four times a day (QID) | ORAL | 0 refills | Status: DC | PRN

## 2020-07-20 MED ORDER — IBUPROFEN 600 MG PO TABS: 600.0000 mg | ORAL_TABLET | Freq: Four times a day (QID) | ORAL | 0 refills | Status: DC | PRN

## 2020-07-20 MED ORDER — IBUPROFEN 800 MG PO TABS
800.0000 mg | ORAL_TABLET | Freq: Once | ORAL | Status: AC
  Administered 2020-07-20: 800 mg via ORAL
  Filled 2020-07-20: qty 1

## 2020-07-20 MED ORDER — OXYCODONE-ACETAMINOPHEN 5-325 MG PO TABS
1.0000 | ORAL_TABLET | Freq: Once | ORAL | Status: AC
  Administered 2020-07-20: 1 via ORAL
  Filled 2020-07-20: qty 1

## 2020-07-20 NOTE — ED Triage Notes (Signed)
Pt presents with c/o right side flank pain that started yesterday. Pt denies any hematuria. Pt reports hx of kidney stones.

## 2020-07-20 NOTE — Discharge Instructions (Addendum)
Drink LOTS of water at home.  Use motrin every 6 hours as needed for pain.  If you have severe pain, you can take a percocet pill.  This is a narcotic, and in some people can lead to addiction.  Do not drive after taking this medicine.  Try to avoid taking this medicine unless you have severe pain.

## 2020-07-20 NOTE — ED Provider Notes (Signed)
[facility]-EMERGENCY DEPT Provider Note   CSN: [id number] Arrival date & time: 07/20/20  0708     History Chief Complaint  Patient presents with  . Flank Pain    Antonio Tate is a 45 y.o. male with a history of kidney stones presenting emergency department with right-sided flank pain. He reports abrupt onset yesterday. It has been constant, waxing and waning in intensity, and currently is maximum intensity. He has not taken any medications for it this morning. He has sharp pain that radiates towards his right groin. He reports some difficulty urination. He says it feels just like his kidney stones in the past. He last had a kidney stone 2 years ago. He denies any other medical problems. He denies any fevers or chills. He denies any drug allergies.  HPI     History reviewed. No pertinent past medical history.  Patient Active Problem List   Diagnosis Date Noted  . Dyslipidemia 12/10/2017  . Bilateral calf pain 07/16/2017  . Premature ejaculation 07/16/2017    History reviewed. No pertinent surgical history.     Family History  Problem Relation Age of Onset  . Heart attack Father   . Alcohol abuse Father   . CVA Brother     Social History   Tobacco Use  . Smoking status: Never Smoker  . Smokeless tobacco: Never Used  Substance Use Topics  . Alcohol use: No  . Drug use: No    Home Medications Prior to Admission medications   Medication Sig Start Date End Date Taking? Authorizing Provider  ibuprofen (ADVIL) 600 MG tablet Take 1 tablet (600 mg total) by mouth every 6 (six) hours as needed for up to 30 doses for moderate pain. 07/20/20  Yes Terald Sleeper, MD  oxyCODONE-acetaminophen (PERCOCET/ROXICET) 5-325 MG tablet Take 2 tablets by mouth every 6 (six) hours as needed for up to 5 doses for severe pain. 07/20/20  Yes Terald Sleeper, MD  acetaminophen (TYLENOL) 325 MG tablet Take 650 mg by mouth every 6 (six) hours as needed for  moderate pain.    [provider]  citalopram (CELEXA) 20 MG tablet Take 1 tablet (20 mg total) by mouth daily. 12/09/17   Ardith Dark, MD  naproxen (NAPROSYN) 500 MG tablet Take 1 tablet (500 mg total) by mouth 2 (two) times daily. 03/13/18   Renne Crigler, PA-C  oxyCODONE-acetaminophen (PERCOCET/ROXICET) 5-325 MG tablet Take 1 tablet by mouth every 4 (four) hours as needed for severe pain. 03/13/18   Renne Crigler, PA-C    Allergies    Patient has no known allergies.  Review of Systems   Review of Systems  Constitutional: Negative for chills and fever.  Eyes: Negative for pain and visual disturbance.  Cardiovascular: Negative for chest pain and palpitations.  Gastrointestinal: Positive for nausea. Negative for vomiting.  Genitourinary: Positive for flank pain. Negative for hematuria.  Musculoskeletal: Negative for back pain and myalgias.  Skin: Negative for color change and rash.  Neurological: Negative for syncope and light-headedness.  All other systems reviewed and are negative.   Physical Exam Updated Vital Signs BP (!) 141/99   Pulse 67   Temp 98.4 F (36.9 C) (Oral)   Resp 16   Ht 5\' 7"  (1.702 m)   Wt 83.9 kg   SpO2 98%   BMI 28.98 kg/m   Physical Exam Constitutional:      Comments: Pacing room, unable to sit still  HENT:     Head: Normocephalic  and atraumatic.  Eyes:     Conjunctiva/sclera: Conjunctivae normal.     Pupils: Pupils are equal, round, and reactive to light.  Cardiovascular:     Rate and Rhythm: Normal rate and regular rhythm.  Pulmonary:     Effort: Pulmonary effort is normal. No respiratory distress.  Abdominal:     Tenderness: There is no right CVA tenderness or left CVA tenderness.  Skin:    General: Skin is warm and dry.  Neurological:     General: No focal deficit present.     Mental Status: He is alert. Mental status is at baseline.  Psychiatric:        Mood and Affect: Mood normal.        Behavior: Behavior normal.      ED Results / Procedures / Treatments   Labs (all labs ordered are listed, but only abnormal results are displayed) Labs Reviewed  URINALYSIS, ROUTINE W REFLEX MICROSCOPIC - Abnormal; Notable for the following components:      Result Value   APPearance HAZY (*)    Hgb urine dipstick LARGE (*)    Ketones, ur 5 (*)    Protein, ur 30 (*)    RBC / HPF >50 (*)    All other components within normal limits    EKG None  Radiology CT Renal Stone Study  Result Date: 07/20/2020 CLINICAL DATA:  Acute right flank pain. EXAM: CT ABDOMEN AND PELVIS WITHOUT CONTRAST TECHNIQUE: Multidetector CT imaging of the abdomen and pelvis was performed following the standard protocol without IV contrast. COMPARISON:  None. FINDINGS: Lower chest: No acute abnormality. Hepatobiliary: No gallstones or biliary dilatation is noted. Hepatic steatosis is noted. Pancreas: Unremarkable. No pancreatic ductal dilatation or surrounding inflammatory changes. Spleen: Normal in size without focal abnormality. Adrenals/Urinary Tract: Adrenal glands appear normal. Minimal right hydroureteronephrosis is noted secondary to 2 mm calculus at the right ureterovesical junction. Urinary bladder is decompressed. Left kidney and ureter are unremarkable. Stomach/Bowel: Stomach is within normal limits. Appendix appears normal. No evidence of bowel wall thickening, distention, or inflammatory changes. Vascular/Lymphatic: No significant vascular findings are present. No enlarged abdominal or pelvic lymph nodes. Reproductive: Prostate is unremarkable. Other: No abdominal wall hernia or abnormality. No abdominopelvic ascites. Musculoskeletal: No acute or significant osseous findings. IMPRESSION: 1. Minimal right hydroureteronephrosis is noted secondary to 2 mm calculus at the right ureterovesical junction. 2. Hepatic steatosis. Electronically Signed   By: Lupita Raider M.D.   On: 07/20/2020 08:16    Procedures Procedures   Medications  Ordered in ED Medications  oxyCODONE-acetaminophen (PERCOCET/ROXICET) 5-325 MG per tablet 1 tablet (1 tablet Oral Given 07/20/20 0739)  ibuprofen (ADVIL) tablet 800 mg (800 mg Oral Given 07/20/20 0739)    ED Course  I have reviewed the triage vital signs and the nursing notes.  Pertinent labs & imaging results that were available during my care of the patient were reviewed by me and considered in my medical decision making (see chart for details).  45 yo male here with ureteral colic CT reviewed with 2 mm right sided stone, no hydronephrosis UA reviewed with hgb, no evidence of infection PO percocet and tylenol given with some relief of pain  Anticipate this stone will likely pass spontaneously  Okay to discharge with meds  Clinical Course as of 07/20/20 1808  Sat Jul 20, 2020  0842  IMPRESSION: 1. Minimal right hydroureteronephrosis is noted secondary to 2 mm calculus at the right ureterovesical junction. 2. Hepatic steatosis. [MT]  [phone]  Pain improved with PO meds.  Sitting more comfortably in bed.  Gave water to help provide urine sample.  Anticipate discharge afterwards. [MT]    Clinical Course User Index [MT] Quyen Cutsforth, Kermit Balo, MD   Final Clinical Impression(s) / ED Diagnoses Final diagnoses:  Kidney stone  Ureteral colic    Rx / DC Orders ED Discharge Orders         Ordered    ibuprofen (ADVIL) 600 MG tablet  Every 6 hours PRN        07/20/20 0845    oxyCODONE-acetaminophen (PERCOCET/ROXICET) 5-325 MG tablet  Every 6 hours PRN        07/20/20 0845           Terald Sleeper, MD 07/20/20 [phone]

## 2020-10-28 ENCOUNTER — Other Ambulatory Visit: Payer: Self-pay

## 2020-10-28 ENCOUNTER — Encounter: Payer: Self-pay | Admitting: Family Medicine

## 2020-10-28 ENCOUNTER — Ambulatory Visit (INDEPENDENT_AMBULATORY_CARE_PROVIDER_SITE_OTHER): Payer: Self-pay | Admitting: Family Medicine

## 2020-10-28 VITALS — BP 111/72 | HR 64 | Temp 98.1°F | Ht 67.0 in | Wt 188.4 lb

## 2020-10-28 DIAGNOSIS — Z6829 Body mass index (BMI) 29.0-29.9, adult: Secondary | ICD-10-CM

## 2020-10-28 DIAGNOSIS — Z0001 Encounter for general adult medical examination with abnormal findings: Secondary | ICD-10-CM

## 2020-10-28 DIAGNOSIS — E785 Hyperlipidemia, unspecified: Secondary | ICD-10-CM

## 2020-10-28 LAB — COMPREHENSIVE METABOLIC PANEL
ALT: 44 U/L (ref 0–53)
AST: 23 U/L (ref 0–37)
Albumin: 4.2 g/dL (ref 3.5–5.2)
Alkaline Phosphatase: 96 U/L (ref 39–117)
BUN: 10 mg/dL (ref 6–23)
CO2: 24 mEq/L (ref 19–32)
Calcium: 8.9 mg/dL (ref 8.4–10.5)
Chloride: 105 mEq/L (ref 96–112)
Creatinine, Ser: 0.94 mg/dL (ref 0.40–1.50)
GFR: 98.89 mL/min (ref >60.00)
Glucose, Bld: 91 mg/dL (ref 70–99)
Potassium: 4.1 mEq/L (ref 3.5–5.1)
Sodium: 139 mEq/L (ref 135–145)
Total Bilirubin: 0.5 mg/dL (ref 0.2–1.2)
Total Protein: 6.9 g/dL (ref 6.0–8.3)

## 2020-10-28 LAB — TSH: TSH: 1.27 u[IU]/mL (ref 0.35–4.50)

## 2020-10-28 LAB — LIPID PANEL
Cholesterol: 224 mg/dL — ABNORMAL HIGH (ref 0–200)
HDL: 42.8 mg/dL (ref >39.00)
NonHDL: 181.35
Total CHOL/HDL Ratio: 5
Triglycerides: 207 mg/dL — ABNORMAL HIGH (ref 0.0–149.0)
VLDL: 41.4 mg/dL — ABNORMAL HIGH (ref 0.0–40.0)

## 2020-10-28 LAB — LDL CHOLESTEROL, DIRECT: Direct LDL: 148 mg/dL

## 2020-10-28 NOTE — Patient Instructions (Signed)
It was very nice to see you today!  We will check blood work today.  I will see back in year.  Come back to see me sooner if needed.  Take care, Dr Jimmey Ralph  PLEASE NOTE:  If you had any lab tests please let us know if you have not heard back within a few days. You may see your results on mychart before we have a chance to review them but we will give you a call once they are reviewed by [place]. If we ordered any referrals today, please let us know if you have not heard from their office within the next week.   Please try these tips to maintain a healthy lifestyle:   Eat at least 3 REAL meals and 1-2 snacks per day.  Aim for no more than 5 hours between eating.  If you eat breakfast, please do so within one hour of getting up.    Each meal should contain half fruits/vegetables, one quarter protein, and one quarter carbs (no bigger than a computer mouse)   Cut down on sweet beverages. This includes juice, soda, and sweet tea.     Drink at least 1 glass of water with each meal and aim for at least 8 glasses per day   Exercise at least 150 minutes every week.    Preventive Care 30-49 Years Old, Male Preventive care refers to lifestyle choices and visits with your health care provider that can promote health and wellness. This includes:  A yearly physical exam. This is also called an annual wellness visit.  Regular dental and eye exams.  Immunizations.  Screening for certain conditions.  Healthy lifestyle choices, such as: ? Eating a healthy diet. ? Getting regular exercise. ? Not using drugs or products that contain nicotine and tobacco. ? Limiting alcohol use. What can I expect for my preventive care visit? Physical exam Your health care provider will check your:  Height and weight. These may be used to calculate your BMI (body mass index). BMI is a measurement that tells if you are at a healthy weight.  Heart rate and blood pressure.  Body temperature.  Skin for  abnormal spots. Counseling Your health care provider may ask you questions about your:  Past medical problems.  Family's medical history.  Alcohol, tobacco, and drug use.  Emotional well-being.  Home life and relationship well-being.  Sexual activity.  Diet, exercise, and sleep habits.  Work and work Astronomer.  Access to firearms. What immunizations do I need? Vaccines are usually given at various ages, according to a schedule. Your health care provider will recommend vaccines for you based on your age, medical history, and lifestyle or other factors, such as travel or where you work.   What tests do I need? Blood tests  Lipid and cholesterol levels. These may be checked every 5 years, or more often if you are over 72 years old.  Hepatitis C test.  Hepatitis B test. Screening  Lung cancer screening. You may have this screening every year starting at age 95 if you have a 30-pack-year history of smoking and currently smoke or have quit within the past 15 years.  Prostate cancer screening. Recommendations will vary depending on your family history and other risks.  Genital exam to check for testicular cancer or hernias.  Colorectal cancer screening. ? All adults should have this screening starting at age 50 and continuing until age 64. ? Your health care provider may recommend screening at age 22 if you  are at increased risk. ? You will have tests every 1-10 years, depending on your results and the type of screening test.  Diabetes screening. ? This is done by checking your blood sugar (glucose) after you have not eaten for a while (fasting). ? You may have this done every 1-3 years.  STD (sexually transmitted disease) testing, if you are at risk. Follow these instructions at home: Eating and drinking  Eat a diet that includes fresh fruits and vegetables, whole grains, lean protein, and low-fat dairy products.  Take vitamin and mineral supplements as recommended  by your health care provider.  Do not drink alcohol if your health care provider tells you not to drink.  If you drink alcohol: ? Limit how much you have to 0-2 drinks a day. ? Be aware of how much alcohol is in your drink. In the [place] one drink equals one 12 oz bottle of beer (355 mL), one 5 oz glass of wine (148 mL), or one 1 oz glass of hard liquor (44 mL).   Lifestyle  Take daily care of your teeth and gums. Brush your teeth every morning and night with fluoride toothpaste. Floss one time each day.  Stay active. Exercise for at least 30 minutes 5 or more days each week.  Do not use any products that contain nicotine or tobacco, such as cigarettes, e-cigarettes, and chewing tobacco. If you need help quitting, ask your health care provider.  Do not use drugs.  If you are sexually active, practice safe sex. Use a condom or other form of protection to prevent STIs (sexually transmitted infections).  If told by your health care provider, take low-dose aspirin daily starting at age 37.  Find healthy ways to cope with stress, such as: ? Meditation, yoga, or listening to music. ? Journaling. ? Talking to a trusted person. ? Spending time with friends and family. Safety  Always wear your seat belt while driving or riding in a vehicle.  Do not drive: ? If you have been drinking alcohol. Do not ride with someone who has been drinking. ? When you are tired or distracted. ? While texting.  Wear a helmet and other protective equipment during sports activities.  If you have firearms in your house, make sure you follow all gun safety procedures. What's next?  Go to your health care provider once a year for an annual wellness visit.  Ask your health care provider how often you should have your eyes and teeth checked.  Stay up to date on all vaccines. This information is not intended to replace advice given to you by your health care provider. Make sure you discuss any questions you  have with your health care provider. Document Revised: 02/28/2019 Document Reviewed: 05/26/2018 Elsevier Patient Education  2021 [employer].

## 2020-10-28 NOTE — Assessment & Plan Note (Signed)
Check labs today.  Discussed lifestyle modifications. 

## 2020-10-28 NOTE — Progress Notes (Signed)
Chief Complaint:  Antonio Tate is a 45 y.o. male who presents today for his annual comprehensive physical exam.    Assessment/Plan:  Chronic Problems Addressed Today: Dyslipidemia Check labs today.  Discussed lifestyle modifications.  Body mass index is 29.51 kg/m. / Overweight  BMI Metric Follow Up - 10/28/20 0903      BMI Metric Follow Up-Please document annually   BMI Metric Follow Up Education provided           Preventative Healthcare: Check labs.  Due for colon cancer screening next year.  Up-to-date on other vaccines and screenings.  Patient Counseling(The following topics were reviewed and/or handout was given):  -Nutrition: Stressed importance of moderation in sodium/caffeine intake, saturated fat and cholesterol, caloric balance, sufficient intake of fresh fruits, vegetables, and fiber.  -Stressed the importance of regular exercise.   -Substance Abuse: Discussed cessation/primary prevention of tobacco, alcohol, or other drug use; driving or other dangerous activities under the influence; availability of treatment for abuse.   -Injury prevention: Discussed safety belts, safety helmets, smoke detector, smoking near bedding or upholstery.   -Sexuality: Discussed sexually transmitted diseases, partner selection, use of condoms, avoidance of unintended pregnancy and contraceptive alternatives.   -Dental health: Discussed importance of regular tooth brushing, flossing, and dental visits.  -Health maintenance and immunizations reviewed. Please refer to Health maintenance section.  Return to care in 1 year for next preventative visit.     Subjective:  HPI:  He has no acute complaints today.   Lifestyle Diet: Balanced. Plenty of fruits and vegetables.  Exercise: Works as a Education administrator.  Depression screen PHQ 2/9 10/28/2020  Decreased Interest 0  Down, Depressed, Hopeless 0  PHQ - 2 Score 0    Health Maintenance Due  Topic Date Due  . Hepatitis C Screening   Never done     ROS: Per HPI, otherwise a complete review of systems was negative.   PMH:  The following were reviewed and entered/updated in epic: No past medical history on file. Patient Active Problem List   Diagnosis Date Noted  . Dyslipidemia 12/10/2017  . Bilateral calf pain 07/16/2017  . Premature ejaculation 07/16/2017   No past surgical history on file.  Family History  Problem Relation Age of Onset  . Heart attack Father   . Alcohol abuse Father   . CVA Brother     Medications- reviewed and updated No current outpatient medications on file.   No current facility-administered medications for this visit.    Allergies-reviewed and updated No Known Allergies  Social History   Socioeconomic History  . Marital status: Married    Spouse name: Not on file  . Number of children: 1  . Years of education: Not on file  . Highest education level: Not on file  Occupational History  . Not on file  Tobacco Use  . Smoking status: Never Smoker  . Smokeless tobacco: Never Used  Substance and Sexual Activity  . Alcohol use: No  . Drug use: No  . Sexual activity: Not on file  Other Topics Concern  . Not on file  Social History Narrative  . Not on file   Social Determinants of Health   Financial Resource Strain: Not on file  Food Insecurity: Not on file  Transportation Needs: Not on file  Physical Activity: Not on file  Stress: Not on file  Social Connections: Not on file        Objective:  Physical Exam: BP 111/72   Pulse 64  Temp 98.1 F (36.7 C) (Temporal)   Ht 5\' 7"  (1.702 m)   Wt 188 lb 6.4 oz (85.5 kg)   SpO2 98%   BMI 29.51 kg/m   Body mass index is 29.51 kg/m. Wt Readings from Last 3 Encounters:  10/28/20 188 lb 6.4 oz (85.5 kg)  07/20/20 185 lb (83.9 kg)  03/13/18 187 lb 3.2 oz (84.9 kg)   Gen: NAD, resting comfortably HEENT: TMs normal bilaterally. OP clear. No thyromegaly noted.  CV: RRR with no murmurs appreciated Pulm: NWOB, CTAB  with no crackles, wheezes, or rhonchi GI: Normal bowel sounds present. Soft, Nontender, Nondistended. MSK: no edema, cyanosis, or clubbing noted Skin: warm, dry Neuro: CN2-12 grossly intact. Strength 5/5 in upper and lower extremities. Reflexes symmetric and intact bilaterally.  Psych: Normal affect and thought content     Erie Sica M. 03/15/18, MD 10/28/2020 9:03 AM

## 2020-10-29 ENCOUNTER — Other Ambulatory Visit: Payer: Self-pay | Admitting: *Deleted

## 2020-10-29 DIAGNOSIS — R899 Unspecified abnormal finding in specimens from other organs, systems and tissues: Secondary | ICD-10-CM

## 2020-10-29 LAB — CBC
HCT: 45.7 % (ref 39.0–52.0)
Hemoglobin: 15.7 g/dL (ref 13.0–17.0)
MCHC: 34.3 g/dL (ref 30.0–36.0)
MCV: 85.2 fl (ref 78.0–100.0)
Platelets: 109 10*3/uL — ABNORMAL LOW (ref 150.0–400.0)
RBC: 5.37 Mil/uL (ref 4.22–5.81)
RDW: 13.6 % (ref 11.5–15.5)
WBC: 7.7 10*3/uL (ref 4.0–10.5)

## 2020-10-29 NOTE — Progress Notes (Signed)
Please inform patient of the following:  His platelets are low but everything else is stable.  I would like for him to come back to recheck.  Please place order for CBC with differential and peripheral smear.

## 2020-10-29 NOTE — Progress Notes (Signed)
cbc

## 2020-10-30 ENCOUNTER — Other Ambulatory Visit (INDEPENDENT_AMBULATORY_CARE_PROVIDER_SITE_OTHER): Payer: Self-pay

## 2020-10-30 ENCOUNTER — Other Ambulatory Visit: Payer: Self-pay

## 2020-10-30 ENCOUNTER — Other Ambulatory Visit: Payer: Self-pay | Admitting: *Deleted

## 2020-10-30 DIAGNOSIS — R899 Unspecified abnormal finding in specimens from other organs, systems and tissues: Secondary | ICD-10-CM

## 2020-10-31 LAB — PATHOLOGIST SMEAR REVIEW

## 2020-10-31 LAB — CBC
HCT: 48.2 % (ref 38.5–50.0)
Hemoglobin: 16.1 g/dL (ref 13.2–17.1)
MCH: 29 pg (ref 27.0–33.0)
MCHC: 33.4 g/dL (ref 32.0–36.0)
MCV: 86.7 fL (ref 80.0–100.0)
MPV: 11.7 fL (ref 7.5–12.5)
Platelets: 123 10*3/uL — ABNORMAL LOW (ref 140–400)
RBC: 5.56 10*6/uL (ref 4.20–5.80)
RDW: 13.1 % (ref 11.0–15.0)
WBC: 6 10*3/uL (ref 3.8–10.8)

## 2020-11-01 ENCOUNTER — Ambulatory Visit (HOSPITAL_COMMUNITY)
Admission: EM | Admit: 2020-11-01 | Discharge: 2020-11-01 | Disposition: A | Discharge status: Home / Self Care | Payer: Self-pay | Attending: Physician Assistant | Admitting: Physician Assistant

## 2020-11-01 ENCOUNTER — Encounter (HOSPITAL_COMMUNITY): Payer: Self-pay | Admitting: Emergency Medicine

## 2020-11-01 ENCOUNTER — Other Ambulatory Visit: Payer: Self-pay

## 2020-11-01 ENCOUNTER — Telehealth: Payer: Self-pay

## 2020-11-01 DIAGNOSIS — S51842A Puncture wound with foreign body of left forearm, initial encounter: Secondary | ICD-10-CM

## 2020-11-01 DIAGNOSIS — S51802A Unspecified open wound of left forearm, initial encounter: Secondary | ICD-10-CM

## 2020-11-01 DIAGNOSIS — T148XXA Other injury of unspecified body region, initial encounter: Secondary | ICD-10-CM

## 2020-11-01 DIAGNOSIS — Z23 Encounter for immunization: Secondary | ICD-10-CM

## 2020-11-01 MED ORDER — DOXYCYCLINE HYCLATE 100 MG PO CAPS: 100.0000 mg | ORAL_CAPSULE | Freq: Two times a day (BID) | ORAL | 0 refills | Status: AC

## 2020-11-01 MED ORDER — TETANUS-DIPHTH-ACELL PERTUSSIS 5-2.5-18.5 LF-MCG/0.5 IM SUSY
0.5000 mL | PREFILLED_SYRINGE | Freq: Once | INTRAMUSCULAR | Status: AC
  Administered 2020-11-01: 0.5 mL via INTRAMUSCULAR

## 2020-11-01 MED ORDER — TETANUS-DIPHTH-ACELL PERTUSSIS 5-2.5-18.5 LF-MCG/0.5 IM SUSY
PREFILLED_SYRINGE | INTRAMUSCULAR | Status: AC
  Filled 2020-11-01: qty 0.5

## 2020-11-01 NOTE — ED Triage Notes (Signed)
Pt presents today with a small piece of wood in his forearm x 2 days. Last tetanus 8-9 years ago.

## 2020-11-01 NOTE — Telephone Encounter (Signed)
Patients daughter is calling back to review lab results due to language barrier just to be sure  [phone] miriam

## 2020-11-01 NOTE — Progress Notes (Signed)
Please inform patient of the following:  Repeat blood work shows he has platelet clumps. This is a benign finding that explains his low platelet counts. Do not need to do any further testing.

## 2020-11-01 NOTE — ED Provider Notes (Signed)
[facility]    CSN: [id number] Arrival date & time: 11/01/20  1834      History   Chief Complaint Chief Complaint  Patient presents with  . Foreign Body in Skin    HPI Antonio Tate is a 45 y.o. male.   Patient presents today with a 1 day history of wood foreign body in left forearm.  Reports that splintered piece of dog got caught in his arm.  He has tried removing this at home without improvement of symptoms.  He has cleaned this with soap and water.  He is not up-to-date on tetanus and open to updating this today.  He denies any fever, nausea, vomiting.  Does report area has become more tender over the past 24 hours.  He is able to use and move left arm without any difficulty.     History reviewed. No pertinent past medical history.  Patient Active Problem List   Diagnosis Date Noted  . Dyslipidemia 12/10/2017  . Bilateral calf pain 07/16/2017  . Premature ejaculation 07/16/2017    History reviewed. No pertinent surgical history.     Home Medications    Prior to Admission medications   Medication Sig Start Date End Date Taking? Authorizing Provider  doxycycline (VIBRAMYCIN) 100 MG capsule Take 1 capsule (100 mg total) by mouth 2 (two) times daily. 11/01/20  Yes Jaythan Hinely, Noberto Retort, PA-C    Family History Family History  Problem Relation Age of Onset  . Heart attack Father   . Alcohol abuse Father   . CVA Brother     Social History Social History   Tobacco Use  . Smoking status: Never Smoker  . Smokeless tobacco: Never Used  Vaping Use  . Vaping Use: Never used  Substance Use Topics  . Alcohol use: No  . Drug use: No     Allergies   Patient has no known allergies.   Review of Systems Review of Systems  Constitutional: Negative for activity change, appetite change, fatigue and fever.  Respiratory: Negative for cough and shortness of breath.   Cardiovascular: Negative for chest pain.  Gastrointestinal: Negative for abdominal  pain, diarrhea, nausea and vomiting.  Skin: Positive for wound.  Neurological: Negative for dizziness, weakness, light-headedness, numbness and headaches.     Physical Exam Triage Vital Signs ED Triage Vitals  Enc Vitals Group     BP 11/01/20 1848 116/77     Pulse Rate 11/01/20 1848 84     Resp 11/01/20 1848 16     Temp 11/01/20 1848 98.2 F (36.8 C)     Temp Source 11/01/20 1848 Oral     SpO2 11/01/20 1848 97 %     Weight --      Height --      Head Circumference --      Peak Flow --      Pain Score 11/01/20 1847 0     Pain Loc --      Pain Edu? --      Excl. in GC? --    No data found.  Updated Vital Signs BP 116/77 (BP Location: Right Arm)   Pulse 84   Temp 98.2 F (36.8 C) (Oral)   Resp 16   SpO2 97%   Visual Acuity Right Eye Distance:   Left Eye Distance:   Bilateral Distance:    Right Eye Near:   Left Eye Near:    Bilateral Near:     Physical Exam Vitals reviewed.  Constitutional:  General: He is awake.     Appearance: Normal appearance. He is normal weight. He is not ill-appearing.     Comments: Very pleasant male appears stated age in no acute distress  HENT:     Head: Normocephalic and atraumatic.  Cardiovascular:     Rate and Rhythm: Normal rate and regular rhythm.     Pulses:          Radial pulses are 2+ on the right side and 2+ on the left side.     Heart sounds: No murmur heard.   Pulmonary:     Effort: Pulmonary effort is normal.     Breath sounds: Normal breath sounds. No stridor. No wheezing, rhonchi or rales.     Comments: Clear to auscultation bilaterally Skin:    Findings: Wound present.          Comments: Patient has 6 cm foreign body noted in left forearm with surrounding erythema.  Area is mildly tender to palpation.  No bleeding or drainage noted.  Foreign body successfully removed in office.  Neurological:     Mental Status: He is alert.  Psychiatric:        Behavior: Behavior is cooperative.      UC Treatments  / Results  Labs (all labs ordered are listed, but only abnormal results are displayed) Labs Reviewed - No data to display  EKG   Radiology No results found.  Procedures Procedures (including critical care time)  Medications Ordered in UC Medications  Tdap (BOOSTRIX) injection 0.5 mL (has no administration in time range)    Initial Impression / Assessment and Plan / UC Course  I have reviewed the triage vital signs and the nursing notes.  Pertinent labs & imaging results that were available during my care of the patient were reviewed by me and considered in my medical decision making (see chart for details).     After informed verbal consent, area was cleaned with Hibiclens and dressed in usual sterile fashion.  Area was numbed with 3 mL of 1% lidocaine with epinephrine.  After several attempts wooden splinter was removed in entirety from arm.  Area was cleaned and dressed.  Discussed there is possibility of some retained foreign body and encouraged him to follow-up with PCP to consider ultrasound to ensure adequate healing of wound.  He was given doxycycline to cover for infection given surrounding erythema and worsening pain.  He was instructed to avoid prolonged sun exposure due to photosensitivity associated with this medication.  Tetanus was updated today.  Strict return precautions given to which patient expressed understanding.  Final Clinical Impressions(s) / UC Diagnoses   Final diagnoses:  Foreign body in skin  Open wound of left forearm, initial encounter     Discharge Instructions     I would recommend following up with PCP to consider ultrasound to ensure we have removed the entire wood splinter.  Take doxycycline twice daily for 10 days to cover for infection.  Your tetanus was updated today.  If you have any worsening symptoms including erythema, numbness, tingling, increased pain you need to be reevaluated.    ED Prescriptions    Medication Sig Dispense Auth.  Provider   doxycycline (VIBRAMYCIN) 100 MG capsule Take 1 capsule (100 mg total) by mouth 2 (two) times daily. 20 capsule Maezie Justin, Noberto Retort, PA-C     PDMP not reviewed this encounter.   Jeani Hawking, PA-C 11/01/20 1922

## 2020-11-01 NOTE — Discharge Instructions (Addendum)
I would recommend following up with PCP to consider ultrasound to ensure we have removed the entire wood splinter.  Take doxycycline twice daily for 10 days to cover for infection.  Your tetanus was updated today.  If you have any worsening symptoms including erythema, numbness, tingling, increased pain you need to be reevaluated.

## 2020-11-01 NOTE — Telephone Encounter (Signed)
Spoke with patient in prefer language Spanish  Give lab results   Repeat blood work shows he has platelet clumps. This is a benign finding that explains his low platelet counts. Do not need to do any further testing Patient verbalized understanding

## 2022-03-09 ENCOUNTER — Encounter: Payer: Self-pay | Admitting: *Deleted

## 2022-04-07 IMAGING — CT CT RENAL STONE PROTOCOL
2 of 4 series · 16 of 46 positions shown, 18 images · non-contrast
Comparison: None.

CLINICAL DATA: Acute right flank pain.

EXAM:
CT ABDOMEN AND PELVIS WITHOUT CONTRAST
TECHNIQUE: Multidetector CT imaging of the abdomen and pelvis was performed
following the standard protocol without IV contrast.

[Series 2: axial st · axial · 0.77mm/px · z∈[+864,+1269]mm · 13 of 91 slices shown, 15 images]
[im 5/91  soft-tissue]
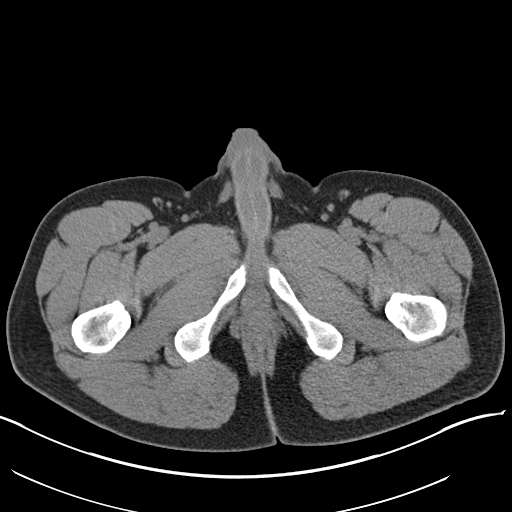
[im 5/91  bone]
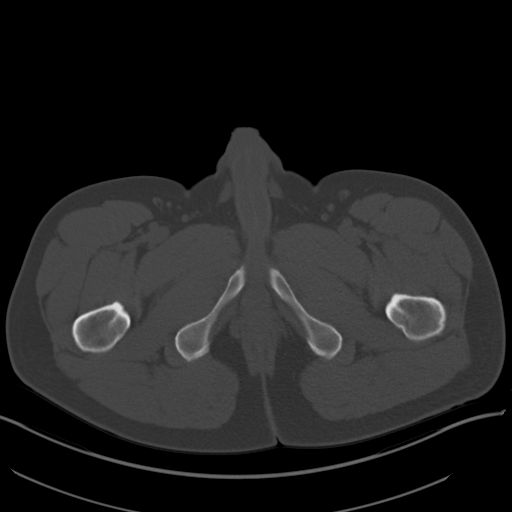
[im 15/91  soft-tissue]
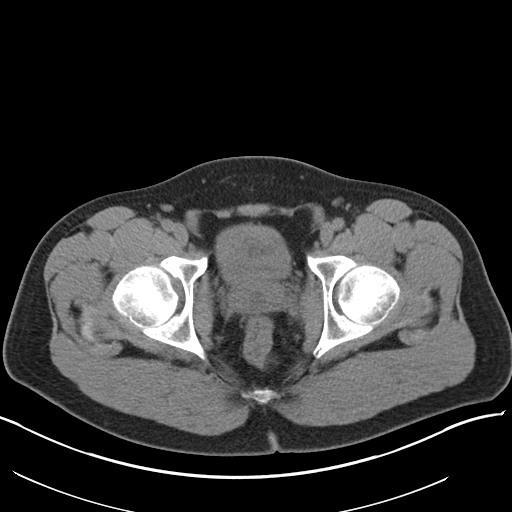
[im 19/91  soft-tissue]
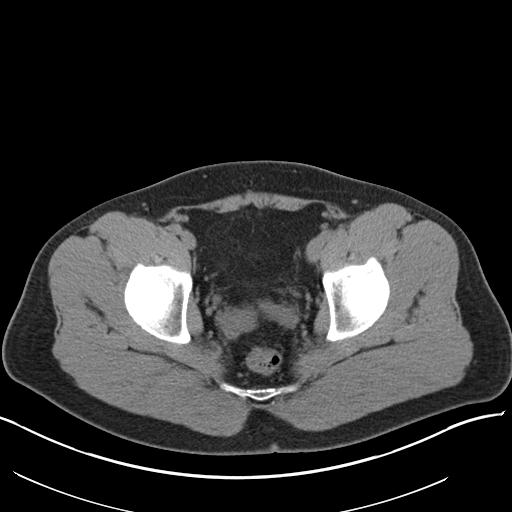
[im 24/91  soft-tissue]
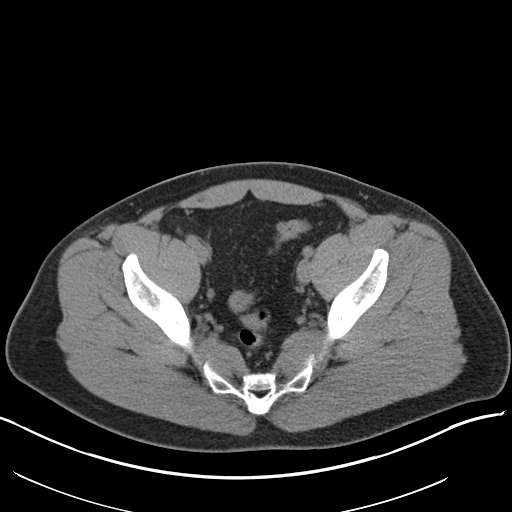
[im 34/91  soft-tissue]
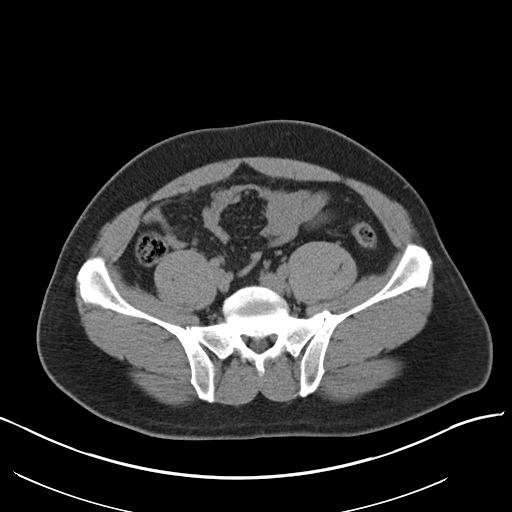
[im 38/91  soft-tissue]
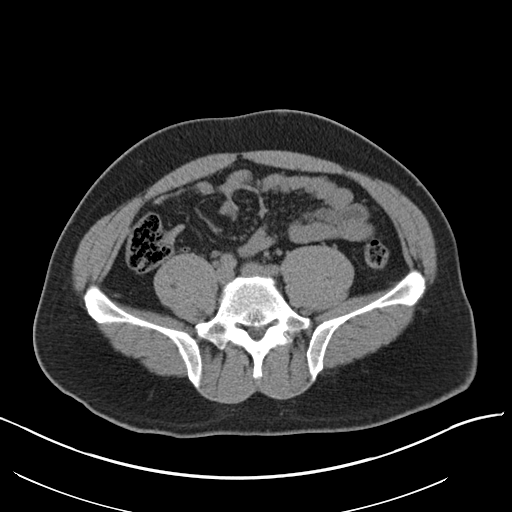
[im 48/91  soft-tissue]
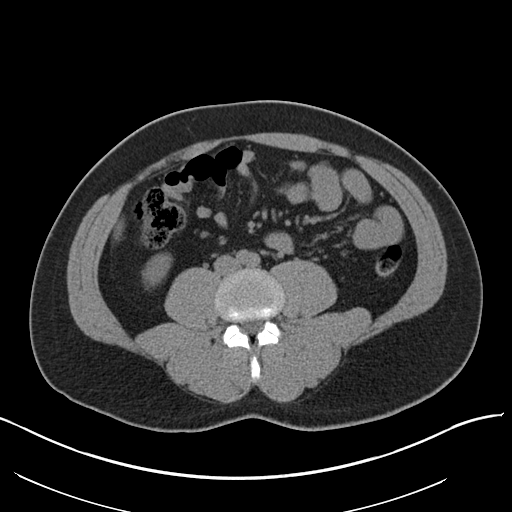
[im 53/91  soft-tissue]
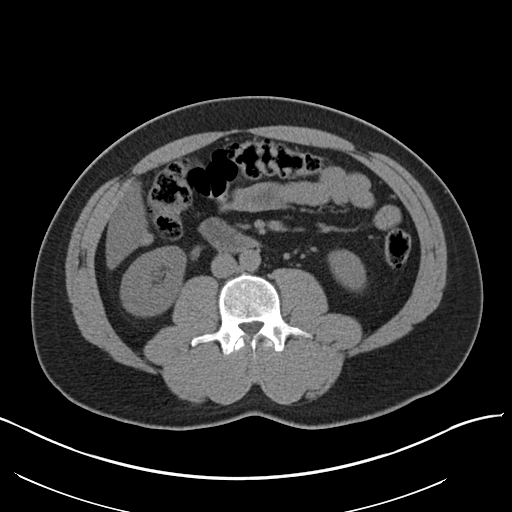
[im 57/91  soft-tissue]
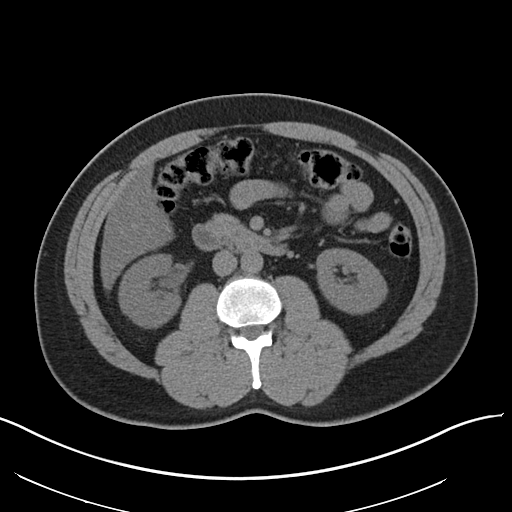
[im 57/91  bone]
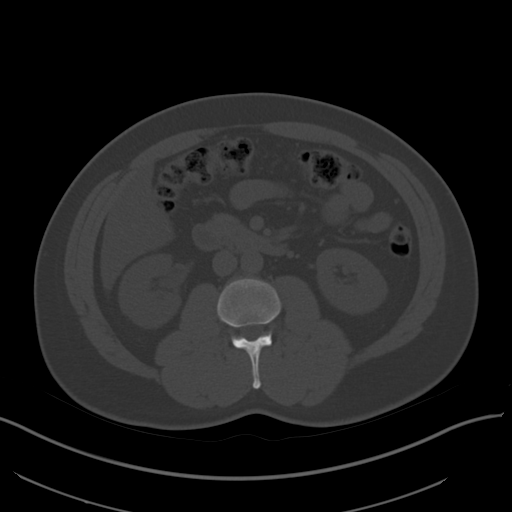
[im 67/91  soft-tissue]
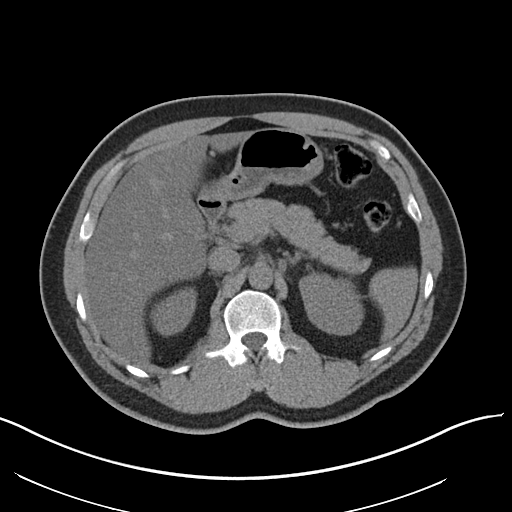
[im 72/91  soft-tissue]
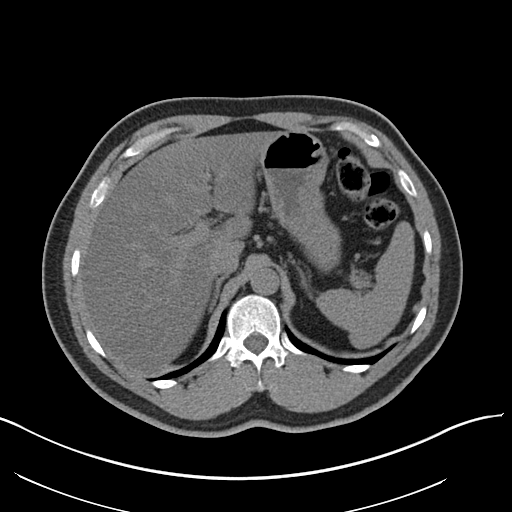
[im 76/91  soft-tissue]
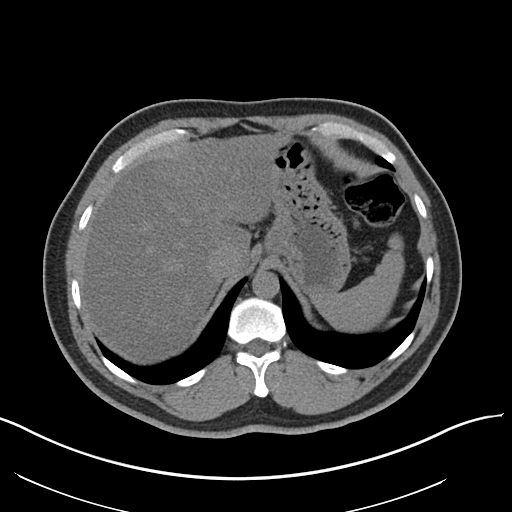
[im 86/91  soft-tissue]
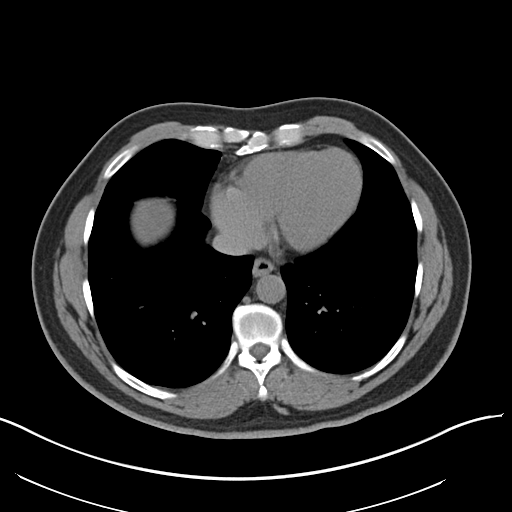

[Series 5: coronal · coronal · 0.73mm/px · 3 of 140 slices shown]
[im 47/140  soft-tissue]
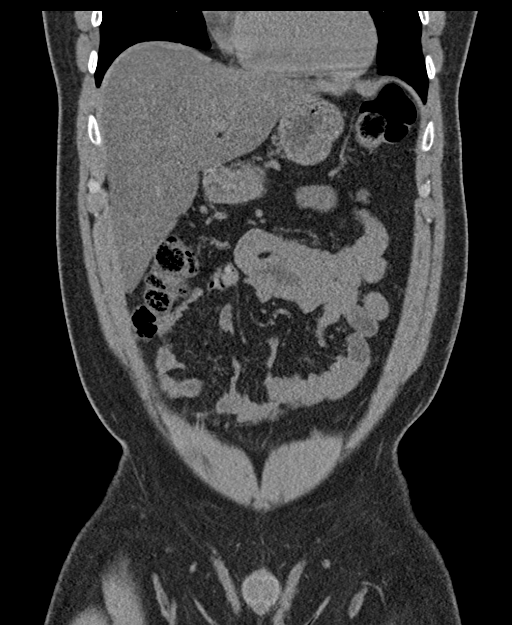
[im 62/140  soft-tissue]
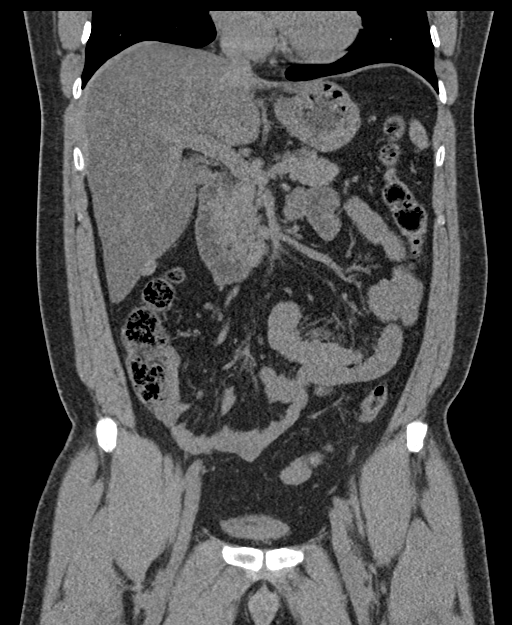
[im 78/140  soft-tissue]
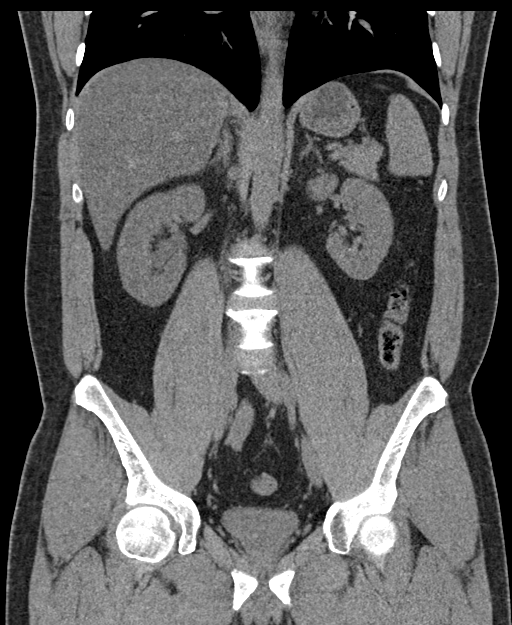

[16 of 46 positions shown; findings below may reference images not displayed]

FINDINGS: Lower chest: No acute abnormality.

Hepatobiliary: No gallstones or biliary dilatation is noted. Hepatic
steatosis is noted.

Pancreas: Unremarkable. No pancreatic ductal dilatation or
surrounding inflammatory changes.

Spleen: Normal in size without focal abnormality.

Adrenals/Urinary Tract: Adrenal glands appear normal. Minimal right
hydroureteronephrosis is noted secondary to 2 mm calculus at the
right ureterovesical junction. Urinary bladder is decompressed. Left
kidney and ureter are unremarkable.

Stomach/Bowel: Stomach is within normal limits. Appendix appears
normal. No evidence of bowel wall thickening, distention, or
inflammatory changes.

Vascular/Lymphatic: No significant vascular findings are present. No
enlarged abdominal or pelvic lymph nodes.

Reproductive: Prostate is unremarkable.

Other: No abdominal wall hernia or abnormality. No abdominopelvic
ascites.

Musculoskeletal: No acute or significant osseous findings.
IMPRESSION: 1. Minimal right hydroureteronephrosis is noted secondary to 2 mm
calculus at the right ureterovesical junction.
2. Hepatic steatosis.

## 2022-05-28 ENCOUNTER — Encounter: Payer: Self-pay | Admitting: *Deleted
